# Patient Record
Sex: Female | Born: 1965 | Race: White | Hispanic: No | Marital: Married
Health system: Southern US, Community
[De-identification: ages and names within clinical notes are randomized; demographics above are authoritative.]

---

## 2008-12-08 ENCOUNTER — Ambulatory Visit: Payer: Self-pay | Admitting: Occupational Medicine

## 2008-12-08 DIAGNOSIS — I10 Essential (primary) hypertension: Secondary | ICD-10-CM | POA: Insufficient documentation

## 2008-12-30 ENCOUNTER — Ambulatory Visit: Payer: Self-pay | Admitting: Family Medicine

## 2008-12-30 ENCOUNTER — Encounter: Admission: RE | Admit: 2008-12-30 | Discharge: 2008-12-30 | Payer: Self-pay | Admitting: Family Medicine

## 2008-12-30 DIAGNOSIS — F319 Bipolar disorder, unspecified: Secondary | ICD-10-CM | POA: Insufficient documentation

## 2008-12-30 DIAGNOSIS — M25519 Pain in unspecified shoulder: Secondary | ICD-10-CM

## 2008-12-31 LAB — CONVERTED CEMR LAB
CO2: 23 meq/L (ref 19–32)
Chloride: 103 meq/L (ref 96–112)
Creatinine, Ser: 0.93 mg/dL (ref 0.40–1.20)
Potassium: 4.4 meq/L (ref 3.5–5.3)

## 2009-02-02 ENCOUNTER — Ambulatory Visit: Payer: Self-pay | Admitting: Family Medicine

## 2009-02-13 ENCOUNTER — Telehealth: Payer: Self-pay | Admitting: Family Medicine

## 2009-05-25 ENCOUNTER — Ambulatory Visit: Payer: Self-pay | Admitting: Family Medicine

## 2009-05-25 DIAGNOSIS — N318 Other neuromuscular dysfunction of bladder: Secondary | ICD-10-CM | POA: Insufficient documentation

## 2009-05-25 LAB — CONVERTED CEMR LAB
Bilirubin Urine: NEGATIVE
Blood in Urine, dipstick: NEGATIVE
Glucose, Urine, Semiquant: NEGATIVE
pH: 5.5

## 2009-05-26 ENCOUNTER — Encounter: Payer: Self-pay | Admitting: Family Medicine

## 2009-08-11 ENCOUNTER — Telehealth: Payer: Self-pay | Admitting: Family Medicine

## 2009-08-19 ENCOUNTER — Ambulatory Visit: Payer: Self-pay | Admitting: Obstetrics & Gynecology

## 2009-12-22 ENCOUNTER — Ambulatory Visit: Payer: Self-pay | Admitting: Family Medicine

## 2009-12-23 ENCOUNTER — Ambulatory Visit: Payer: Self-pay | Admitting: Family Medicine

## 2010-02-17 ENCOUNTER — Telehealth: Payer: Self-pay | Admitting: Family Medicine

## 2010-04-13 NOTE — Progress Notes (Signed)
Summary: Refill her BP med to a different pharmacy  Phone Note Refill Request Message from:  Patient on February 17, 2010 1:33 PM  Refills Requested: Medication #1:  LOSARTAN POTASSIUM 100 MG TABS take one tablet by mouth once a day   Dosage confirmed as above?Dosage Confirmed PT JUST MOVED AND NEEDS HER BP MED REFILL. SHE HAS 1 PILL LEFT. CAN YOU CALL INTO [facility] ON [address] PHONE NUMBER IS [phone].Michaelle Copas  February 17, 2010 1:35 PM     Initial call taken by: Michaelle Copas,  February 17, 2010 1:35 PM  Follow-up for Phone Call        called pt. pharm fillled lamical instesad of losartan.called and refill med to right pharm Follow-up by: Avon Gully CMA, Duncan Dull),  February 17, 2010 2:04 PM

## 2010-04-13 NOTE — Assessment & Plan Note (Signed)
Summary: CPE   Vital Signs:  Patient profile:   45 year old female Height:      66 inches Weight:      212 pounds BMI:     34.34 O2 Sat:      98 % on Room air Temp:     98.0 degrees F oral Pulse rate:   84 / minute BP sitting:   112 / 80  (left arm) Cuff size:   regular  Vitals Entered By: Payton Spark CMA (May 25, 2009 10:21 AM)  O2 Flow:  Room air CC: CPE w/ out pap   Primary Care Provider:  Nani Gasser MD  CC:  CPE w/ out pap.  History of Present Illness: Has been urinating more recently.  No dysuria or hematuria. Has noticed having a hardter time making it to the bathroom on time a=s will have the sudden urge to go. Denies any stress incontinence. Has been going on for months. Now gets worried about making it to the bathroom on time.   Still living in an apartment waiting for her house to be sold. Has been eating more  because of stress. Has been talking to her therapist about it. Feels very stressed out. Has been unable to work out as her treadmill is still in storage.     Current Medications (verified): 1)  Lamictal 200 Mg Tabs (Lamotrigine) .... Take One Tablet By Mouth Three Times A Day 2)  Losartan Potassium 100 Mg Tabs (Losartan Potassium) .... Take One Tablet By Mouth Once A Day 3)  Lorazepam 1 Mg Tabs (Lorazepam) .... Take 2 Tablets By Mouth As Needed 4)  Geodon 80 Mg Caps (Ziprasidone Hcl) .... Take One Tablet By Mouth Once A Day 5)  Concerta 18 Mg Cr-Tabs (Methylphenidate Hcl) .... Take One Tablet By Mouth Once A Day 6)  Premarin 1.25 Mg Tabs (Estrogens Conjugated)  Allergies (verified): No Known Drug Allergies  Past History:  Family History: Last updated: 12/30/2008 Mother, Hyperlipidemia, alcoholism Father, MI, drug use GM with CHF  Past Surgical History: Hysterectomy 12/09 - still has her cervix.   Social History: [facility] [employer] [employer].  2 yrs of college.  Marrried to [place] wiht 1 kid. Non-smoker ETOH-yes, rarely No Drugs Stay at home  mom.    Review of Systems  The patient denies anorexia, fever, weight loss, weight gain, vision loss, decreased hearing, hoarseness, chest pain, syncope, dyspnea on exertion, peripheral edema, prolonged cough, headaches, hemoptysis, abdominal pain, melena, hematochezia, severe indigestion/heartburn, hematuria, incontinence, genital sores, muscle weakness, suspicious skin lesions, transient blindness, difficulty walking, depression, unusual weight change, abnormal bleeding, enlarged lymph nodes, and breast masses.    Physical Exam  General:  Well-developed,well-nourished,in no acute distress; alert,appropriate and cooperative throughout examination Head:  Normocephalic and atraumatic without obvious abnormalities. No apparent alopecia or balding. Eyes:  No corneal or conjunctival inflammation noted. EOMI. Perrla. Wears glasses.  Ears:  External ear exam shows no significant lesions or deformities.  Otoscopic examination reveals clear canals, tympanic membranes are intact bilaterally without bulging, retraction, inflammation or discharge. Hearing is grossly normal bilaterally. Nose:  External nasal examination shows no deformity or inflammation. Nasal mucosa are pink and moist without lesions or exudates. Mouth:  Oral mucosa and oropharynx without lesions or exudates.  Teeth in good repair. Neck:  No deformities, masses, or tenderness noted. Chest Wall:  No deformities, masses, or tenderness noted. Breasts:  No mass, nodules, thickening, tenderness, bulging, retraction, inflamation, nipple discharge or skin changes noted.   Lungs:  Normal respiratory effort, chest expands symmetrically. Lungs are clear to auscultation, no crackles or wheezes. Heart:  Normal rate and regular rhythm. S1 and S2 normal without gallop, murmur, click, rub or other extra sounds. Abdomen:  Bowel sounds positive,abdomen soft and non-tender without masses, organomegaly or hernias noted. Msk:  No deformity or scoliosis noted  of thoracic or lumbar spine.   Pulses:  R and L carotid,radial,dorsalis pedis and posterior tibial pulses are full and equal bilaterally Extremities:  No clubbing, cyanosis, edema, or deformity noted with normal full range of motion of all joints.   Neurologic:  No cranial nerve deficits noted. Station and gait are normal. DTRs are symmetrical throughout. Sensory, motor and coordinative functions appear intact. Skin:  no rashes.   Cervical Nodes:  No lymphadenopathy noted Axillary Nodes:  No palpable lymphadenopathy Psych:  Cognition and judgment appear intact. Alert and cooperative with normal attention span and concentration. No apparent delusions, illusions, hallucinations   Impression & Recommendations:  Problem # 1:  HEALTH MAINTENANCE EXAM (ICD-V70.0) Assessment Deteriorated Encoraged her schedule her pap with gyn since she still has a cervix.   Encouraged daily calcium intake with vitamin D.  Due for screening labs.  Dsicussed maybe joining a gym since can't get to her treadmill right now.   Orders: T-Comprehensive Metabolic Panel [phone]) T-Lipid Profile [phone])  Problem # 2:  HYPERTENSION (ICD-401.9)  Looks great today.  Her updated medication list for this problem includes:    Losartan Potassium 100 Mg Tabs (Losartan potassium) .Marland Kitchen... Take one tablet by mouth once a day  BP today: 112/80 Prior BP: 113/76 (02/02/2009)  Prior 10 Yr Risk Heart Disease: Not enough information (12/30/2008)  Labs Reviewed: K+: 4.4 (12/30/2008) Creat: : 0.93 (12/30/2008)     Orders: UA Dipstick w/o Micro (automated)  (81003)  Problem # 3:  MANIC DEPRESSIVE ILLNESS (ICD-296.80) Encouraged her to make an appointment with Dr. Christell Constant downstairs for medication management since she has realy been struggling with her stress levels lately. Being unable to sell their haouse has really put alot of pressure on her.   Problem # 4:  OVERACTIVE BLADDER (ICD-596.51) UA is neg. Discussedl  ikely has OAB. There are mecdications to help wiht this but pt wants to hold off at this time. Alos PT is a possiblity.   Complete Medication List: 1)  Lamictal 200 Mg Tabs (Lamotrigine) .... Take one tablet by mouth three times a day 2)  Losartan Potassium 100 Mg Tabs (Losartan potassium) .... Take one tablet by mouth once a day 3)  Lorazepam 1 Mg Tabs (Lorazepam) .... Take 2 tablets by mouth as needed 4)  Geodon 80 Mg Caps (Ziprasidone hcl) .... Take one tablet by mouth once a day 5)  Concerta 18 Mg Cr-tabs (Methylphenidate hcl) .... Take one tablet by mouth once a day 6)  Premarin 1.25 Mg Tabs (Estrogens conjugated)  Patient Instructions: 1)  Consider trying valerian root tea in evening to help with sleep adn curb your appetite.    Tetanus/Td Immunization History:    Tetanus/Td # 1:  given (12/12/2008)   TD Result Date:  12/12/2008 TD Result:  given TD Next Due:  10 yr PAP Result Date:  07/12/2008 PAP Result:  normal PAP Next Due:  1 yr  Laboratory Results   Urine Tests    Routine Urinalysis   Color: yellow Appearance: Clear Glucose: negative   (Normal Range: Negative) Bilirubin: negative   (Normal Range: Negative) Ketone: trace (5)   (Normal Range: Negative) Spec. Gravity: >=  1.030   (Normal Range: 1.003-1.035) Blood: negative   (Normal Range: Negative) pH: 5.5   (Normal Range: 5.0-8.0) Protein: negative   (Normal Range: Negative) Urobilinogen: 1.0   (Normal Range: 0-1) Nitrite: negative   (Normal Range: Negative) Leukocyte Esterace: negative   (Normal Range: Negative)

## 2010-04-13 NOTE — Progress Notes (Signed)
Summary: Estrogen  Phone Note Call from Patient   Caller: Patient Summary of Call: Pt states Dr. Judie Petit discussed starting her on estrogen at her CPE. Pt wasn't ready to start then but would like to now. Pt would like Rx estrogen sent to her pharm. Initial call taken by: Payton Spark CMA,  Aug 11, 2009 4:45 PM  Follow-up for Phone Call        it appears she has a gyn and is due for her pap. Follow-up by: Seymour Bars DO,  Aug 11, 2009 4:52 PM  Additional Follow-up for Phone Call Additional follow up Details #1::        Pt states she has not est w/ GYN yet and will not be able to until Oct 23, 2022. Pt states there was a death in the family and she won't have time. I gave Pt info to [facility].  Additional Follow-up by: Payton Spark CMA,  Aug 11, 2009 4:59 PM

## 2010-04-13 NOTE — Assessment & Plan Note (Signed)
Summary: FLU-SHOT-VEW  Nurse Visit   Allergies: No Known Drug Allergies  Immunizations Administered:  Influenza Vaccine # 1:    Vaccine Type: Fluvax 3+    Site: left deltoid    Mfr: GlaxoSmithKline    Dose: 0.5 ml    Route: IM    Given by: Sue Lush McCrimmon CMA, (AAMA)    Exp. Date: 09/11/2010    Lot #: [id number]    VIS given: 10/06/09 version given December 23, 2009.  Flu Vaccine Consent Questions:    Do you have a history of severe allergic reactions to this vaccine? no    Any prior history of allergic reactions to egg and/or gelatin? no    Do you have a sensitivity to the preservative Thimersol? no    Do you have a past history of Guillan-Barre Syndrome? no    Do you currently have an acute febrile illness? no    Have you ever had a severe reaction to latex? no    Vaccine information given and explained to patient? no    Are you currently pregnant? no  Orders Added: 1)  Flu Vaccine 17yrs + [[id number]] 2)  Admin 1st Vaccine [[id number]]

## 2010-04-13 NOTE — Miscellaneous (Signed)
  Clinical Lists Changes  Observations: Changed observation from TD BOOSTER: given (12/12/2008 10:26) to TD BOOSTER: given (12/13/2007 10:26)

## 2010-05-28 ENCOUNTER — Encounter: Payer: Self-pay | Admitting: Family Medicine

## 2010-06-22 IMAGING — CR DG SHOULDER 2+V*L*
3 series · 3 of 3 positions shown · non-contrast
Comparison: None

CLINICAL DATA: Left shoulder pain.

LEFT SHOULDER - 2+ VIEW

[view not recorded (1 of 3)]
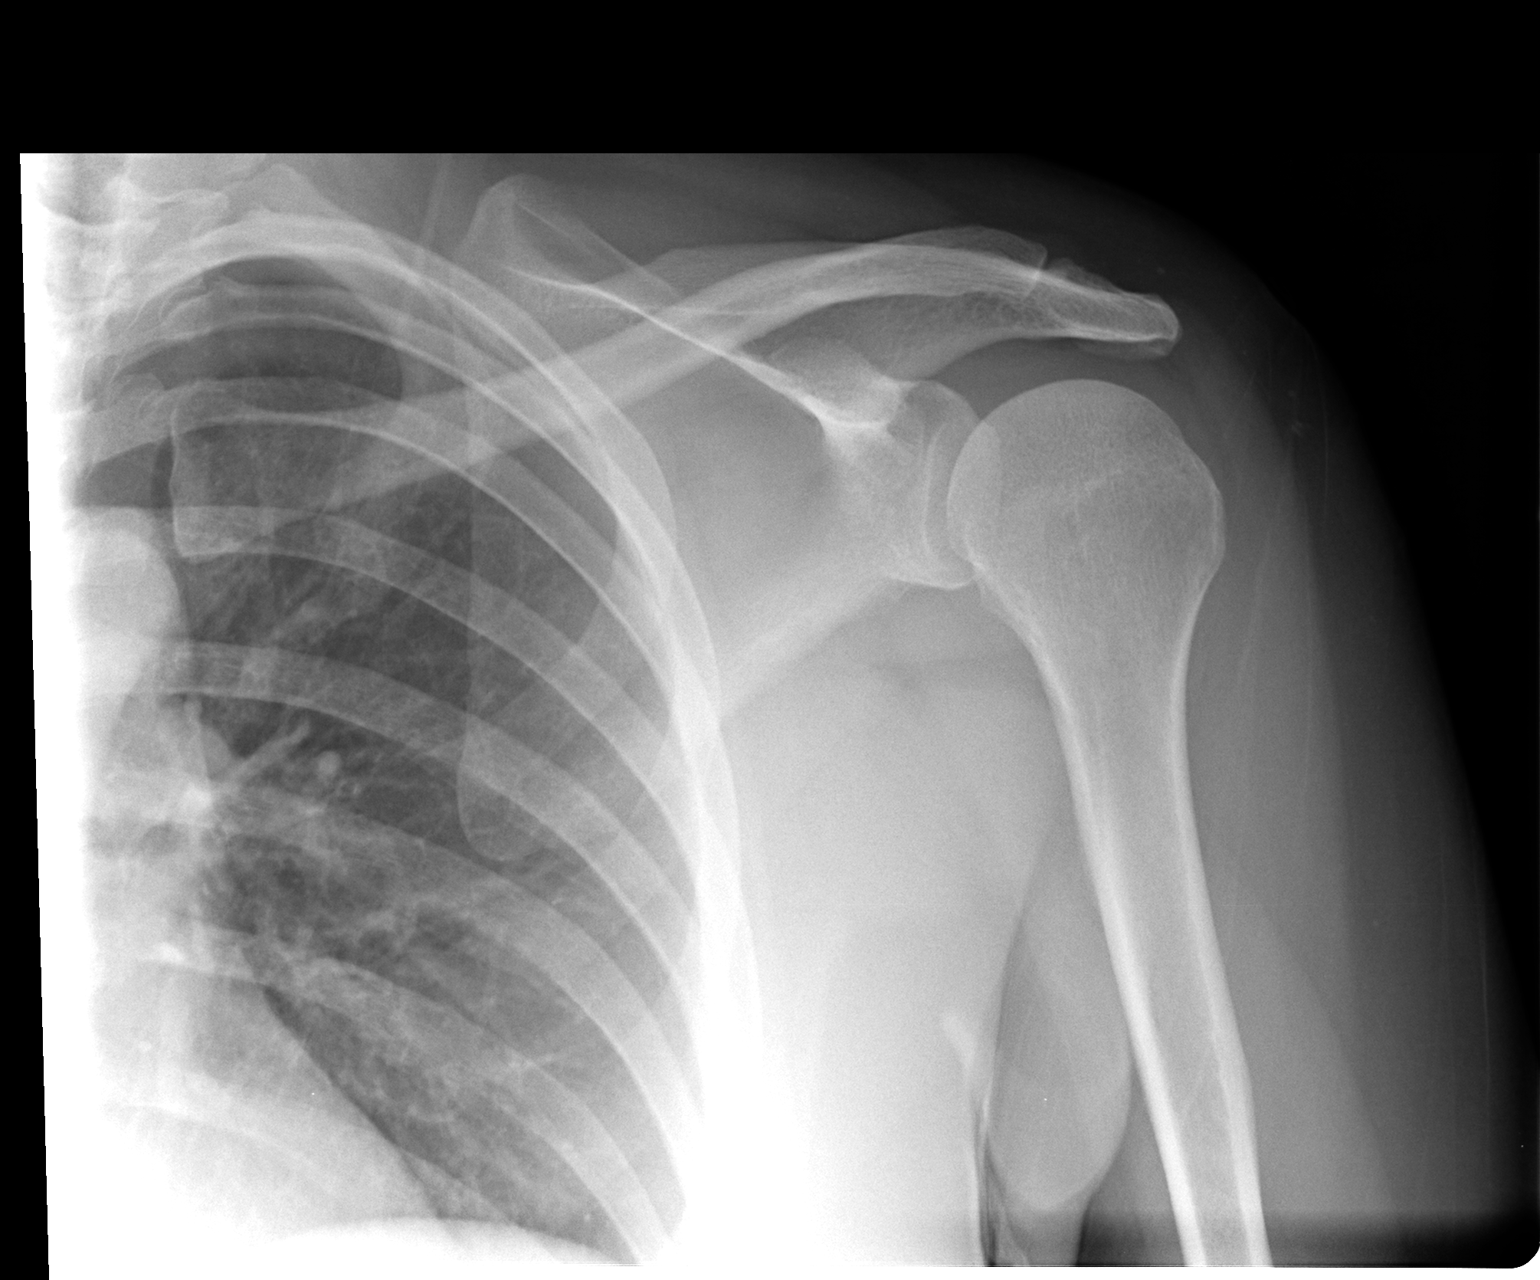

[view not recorded (2 of 3)]
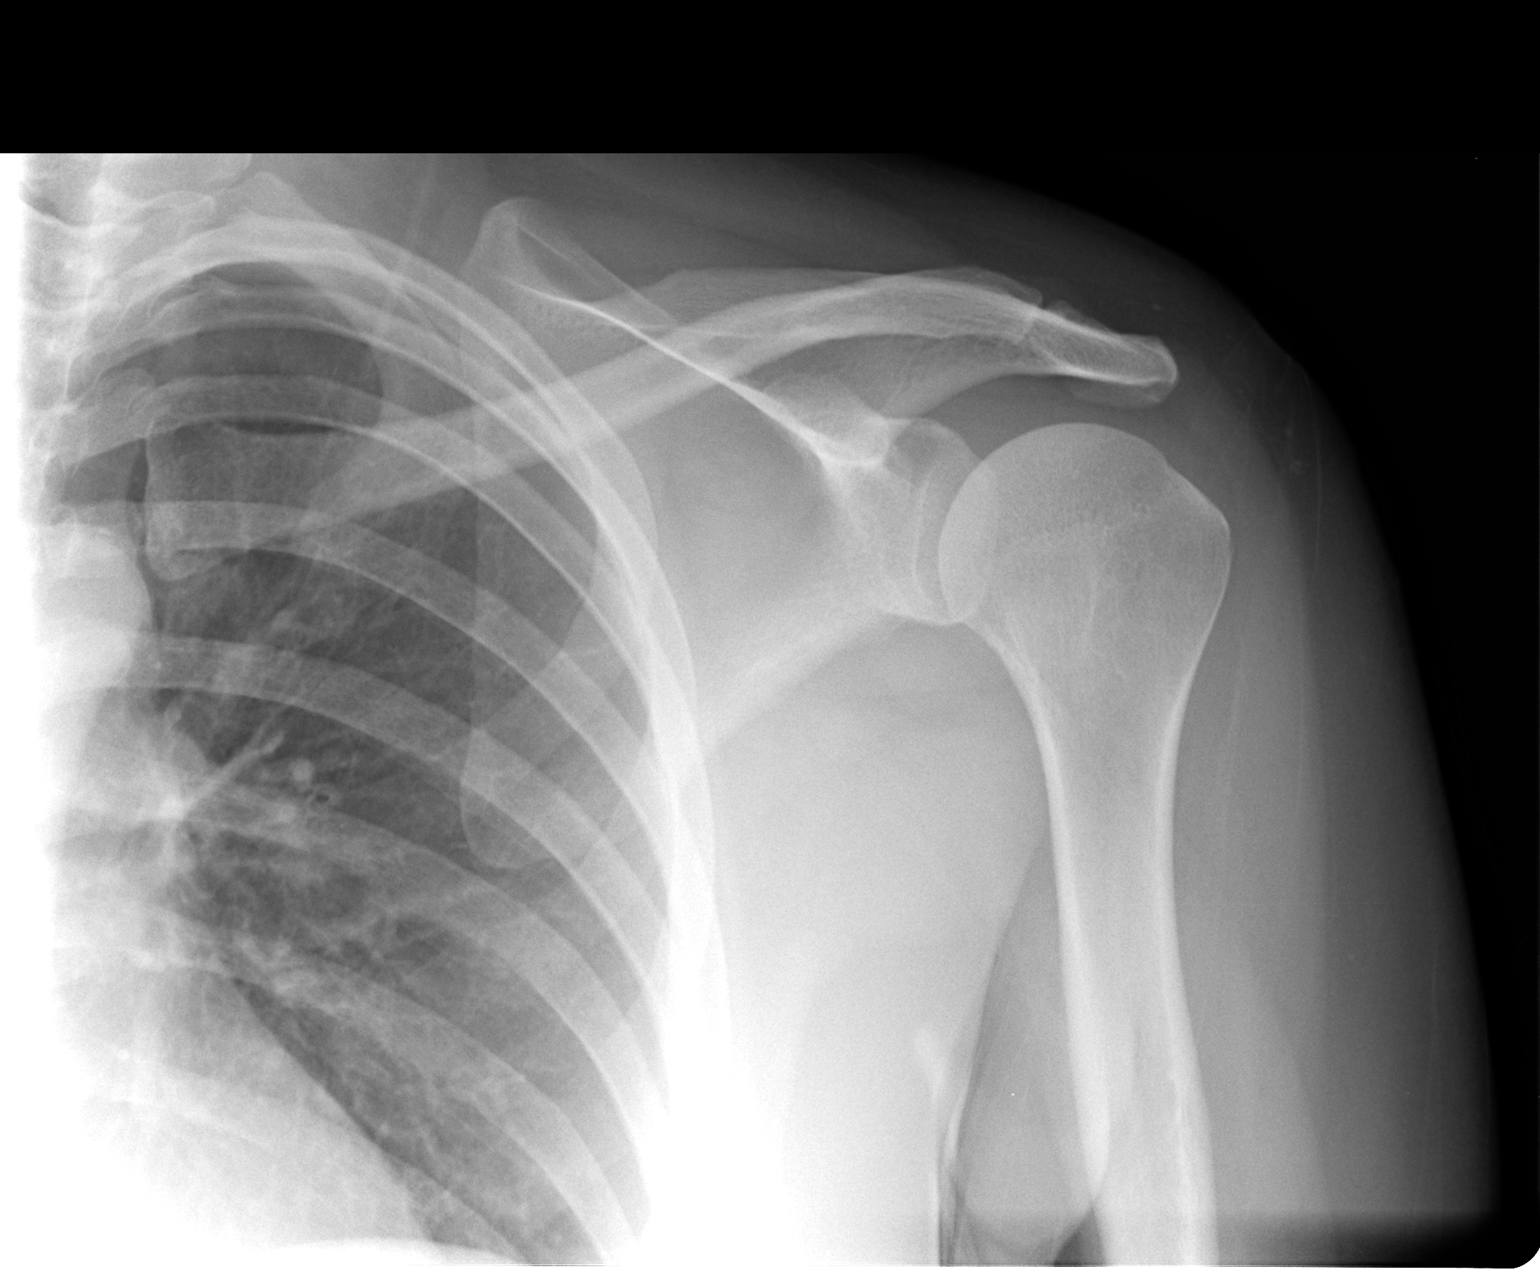

[view not recorded (3 of 3)]
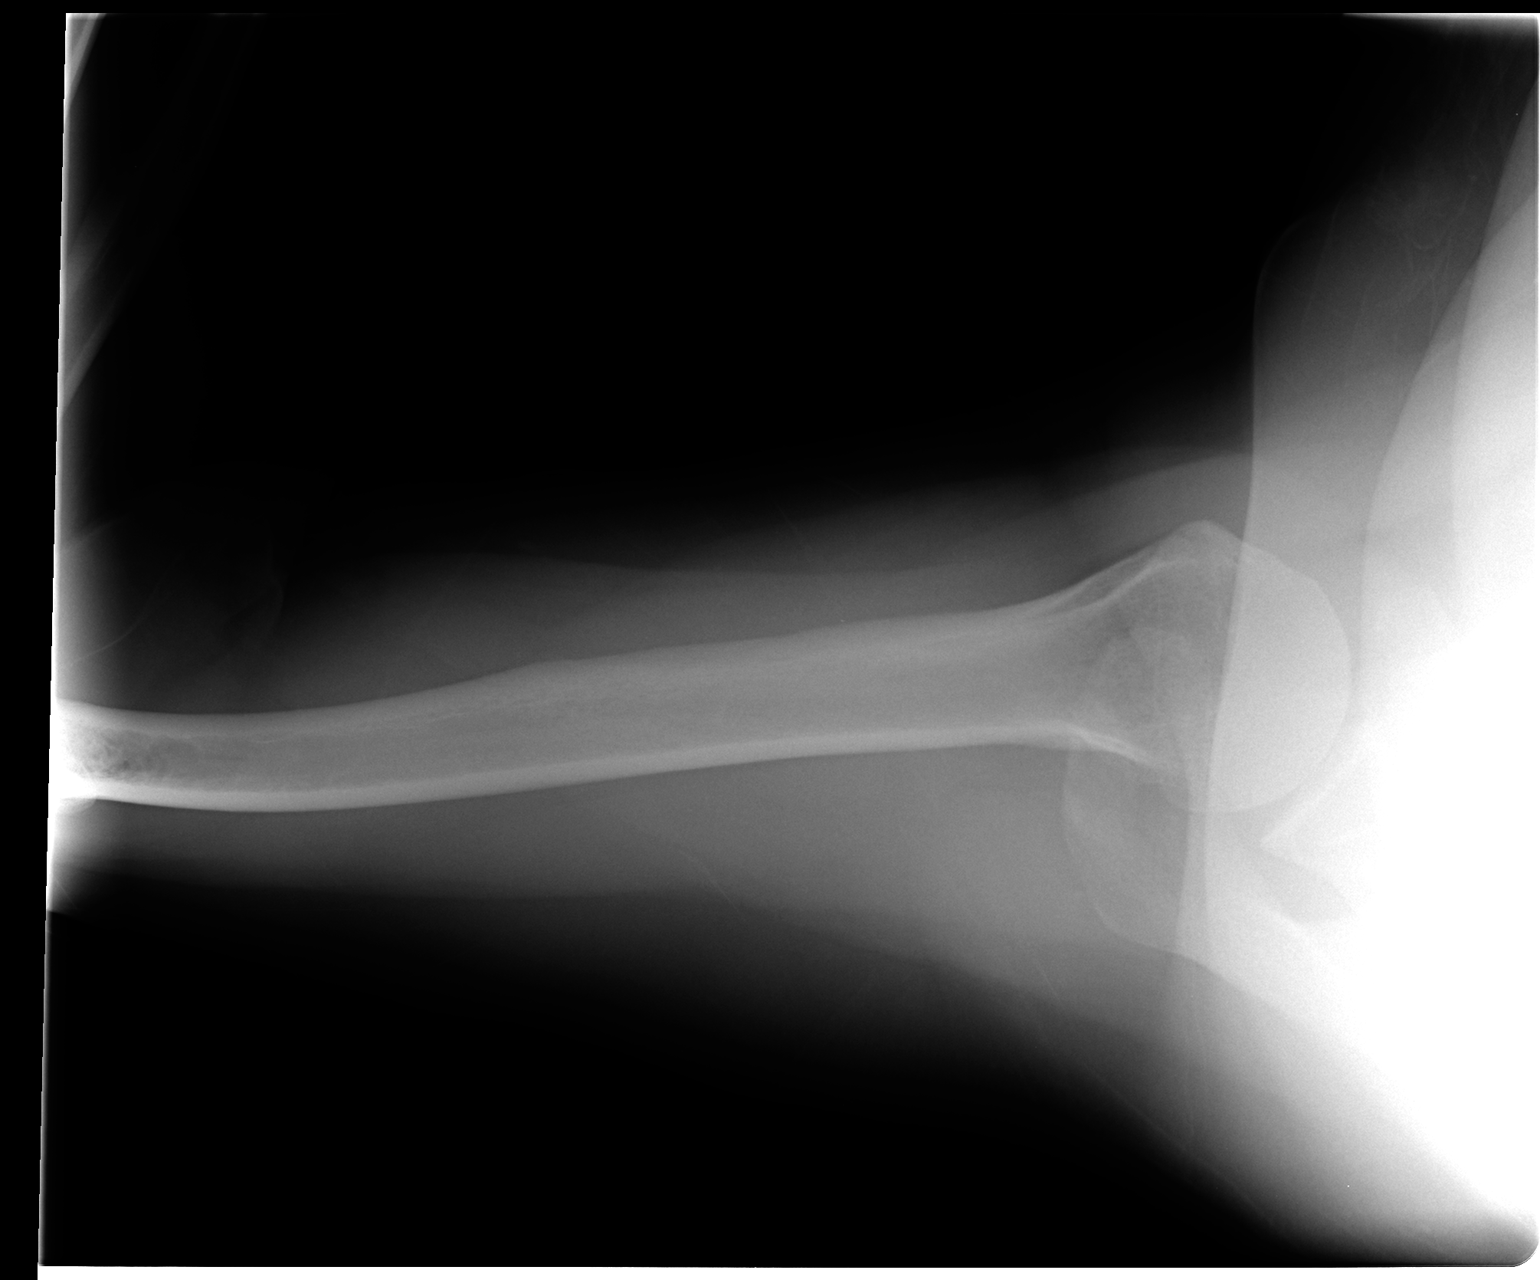

[3 of 3 positions shown; findings below may reference images not displayed]

FINDINGS: The joint spaces are maintained.  No fractures are seen.
No degenerative changes.  No abnormal soft tissue calcifications.
The left lung apex is clear.
IMPRESSION: No acute bony findings.

## 2010-07-27 NOTE — Assessment & Plan Note (Signed)
[name], [name]            ACCOUNT NO.:  [id number]   MEDICAL RECORD NO.:  [id number]          PATIENT TYPE:  POB   LOCATION:  [facility] [place]         FACILITY:  [facility]   PHYSICIAN:  Elsie Lincoln, MD      DATE OF BIRTH:  1965/06/17   DATE OF SERVICE:  08/19/2009                                  CLINIC NOTE   The patient is a 45 year old G2, para 1-0-1-1 female who presents for  her yearly exam.  The patient had a supracervical hysterectomy in 2009  in [place].  The patient chose supracervical hysterectomy for  support.  She is not very happy with the results.  She had hysterectomy  for pain and abnormal bleeding.  They found endometriosis.  She also had  a history of a laparoscopy in 2004 for mild endometriosis.  The patient  does have menopausal symptoms and is on estradiol 1 mg daily.  She would  like to continue this.  We discussed the risks and benefits of hormone  replacement therapy, but given that she is already somewhat symptomatic  with the estradiol and she is still only 45 years old, I think that we  can continue this and we discussed at the age of 80 which is what her  prior doctor had said.  There is not much research out there on surgical  menopause and risks of HRT, so 50 I think is a reasonable age.  We can  just try to wean earlier if the patient would desire.  The patient  understands there is a risk of breast cancer, coronary artery events,  blood clots, and stroke.   PAST MEDICAL HISTORY:  Manic depression, high blood pressure.   PAST SURGICAL HISTORY:  Hysterectomy and laparoscopy.   SOCIAL HISTORY:  The patient does not work.  She does not smoke, do  drugs or drink alcohol.  She has never been abused.   FAMILY HISTORY:  Positive for heart disease in her grandmother and high  blood pressure in her father.   OBSTETRICAL HISTORY:  One NSVD of a 59 year old son currently, one  termination.   GYNECOLOGIC HISTORY:  Endometriosis.  No history  of abnormal Pap smears,  fibroid tumors, ovarian cysts or sexually transmitted diseases.   MEDICATIONS:  Lorazepam, Lamictal, Cozaar, Geodon, Concerta, and  estradiol.   ALLERGIES:  No known allergies.  No latex allergy.   REVIEW OF SYSTEMS:  Completely negative as indicated on the intake  sheet.   PHYSICAL EXAMINATION:  VITAL SIGNS:  Pulse 74, blood pressure 114/73,  weight 209, height 66 inches.  GENERAL:  Well nourished, well developed, no apparent stress.  HEENT:  Normocephalic, atraumatic.  NECK:  Thyroid, no masses.  LUNGS:  Clear to auscultation bilaterally.  HEART:  Regular rate and rhythm.  BREASTS:  No masses, lymphadenopathy, or nipple discharge.  ABDOMEN:  Soft, nontender.  No organomegaly.  No hernia.  PELVIC:  Genitalia, Tanner V.  Vagina pink, normal rugae.  No evidence  of atrophic vaginitis.  Cervix closed.  Bimanual; cervix is felt, uterus  and ovaries surgically absent, nontender to palpation.  EXTREMITIES:  No edema.   ASSESSMENT AND PLAN:  A 45 year old female for well-woman  exam.  1. Pap smear.  Next Pap smear due in 2 years as the patient has no      abnormal Pap smears.  2. Continue estradiol for now.  Risk and benefits discussed as above.  3. Astroglide for lubrication during sex.  4. Return to clinic in a year.  5. Dr. Linford Arnold follows patient's mammogram.           ______________________________  Elsie Lincoln, MD     KL/MEDQ  D:  08/19/2009  T:  08/20/2009  Job:  [id number]

## 2010-09-27 ENCOUNTER — Other Ambulatory Visit: Payer: Self-pay | Admitting: *Deleted

## 2010-09-27 ENCOUNTER — Encounter: Payer: Self-pay | Admitting: *Deleted

## 2010-09-27 DIAGNOSIS — E8940 Asymptomatic postprocedural ovarian failure: Secondary | ICD-10-CM

## 2010-09-27 MED ORDER — ESTRADIOL 1 MG PO TABS: 1.0000 mg | ORAL_TABLET | Freq: Every day | ORAL | Status: AC

## 2011-02-02 ENCOUNTER — Ambulatory Visit: Payer: Self-pay | Admitting: Obstetrics & Gynecology

## 2011-02-09 ENCOUNTER — Ambulatory Visit: Payer: Self-pay | Admitting: Obstetrics & Gynecology

## 2017-01-10 NOTE — Progress Notes (Unsigned)
error
